# Patient Record
Sex: Male | Born: 2000 | Race: White | Hispanic: No | Marital: Single | Smoking: Never smoker
Health system: Southern US, Community
[De-identification: ages and names within clinical notes are randomized; demographics above are authoritative.]

## PROBLEM LIST (undated history)

## (undated) DIAGNOSIS — F840 Autistic disorder: Secondary | ICD-10-CM

---
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
## 2020-08-17 ENCOUNTER — Telehealth: Payer: Self-pay

## 2020-08-23 NOTE — Telephone Encounter (Signed)
Error

## 2022-08-24 ENCOUNTER — Emergency Department
Admission: EM | Admit: 2022-08-24 | Discharge: 2022-08-24 | Disposition: A | Discharge status: Home / Self Care | Payer: Medicare Other | Attending: Emergency Medicine | Admitting: Emergency Medicine

## 2022-08-24 ENCOUNTER — Other Ambulatory Visit: Payer: Self-pay

## 2022-08-24 DIAGNOSIS — M62838 Other muscle spasm: Secondary | ICD-10-CM | POA: Insufficient documentation

## 2022-08-24 DIAGNOSIS — M542 Cervicalgia: Secondary | ICD-10-CM | POA: Insufficient documentation

## 2022-08-24 DIAGNOSIS — F840 Autistic disorder: Secondary | ICD-10-CM | POA: Insufficient documentation

## 2022-08-24 MED ORDER — KETOROLAC TROMETHAMINE 15 MG/ML IJ SOLN
15.0000 mg | Freq: Once | INTRAMUSCULAR | Status: AC
  Administered 2022-08-24: 15 mg via INTRAMUSCULAR
  Filled 2022-08-24: qty 1

## 2022-08-24 MED ORDER — NAPROXEN 500 MG PO TABS: 500.0000 mg | ORAL_TABLET | Freq: Two times a day (BID) | ORAL | 0 refills | Status: AC

## 2022-08-24 MED ORDER — LIDOCAINE 5 % EX PTCH: 1.0000 | MEDICATED_PATCH | Freq: Two times a day (BID) | CUTANEOUS | 0 refills | Status: AC

## 2022-08-24 MED ORDER — LIDOCAINE 5 % EX PTCH
1.0000 | MEDICATED_PATCH | CUTANEOUS | Status: DC
  Administered 2022-08-24: 1 via TRANSDERMAL
  Filled 2022-08-24: qty 1

## 2022-08-24 NOTE — Discharge Instructions (Signed)
Continue to use warm compresses and gentle stretching and massage to the area.  You may also take the medications as prescribed to help with your symptoms.  Your symptoms may be due to lifting heavy objects, be cognizant of this and be sure to use good form, and try to reduce the amount of weight that you lift at a time.  Please return for any new, worsening, or change in symptoms or other concerns including headaches, visual changes, numbness, tingling, weakness, shooting pains down your arms, fevers, chills, or any other concerns.  It was a pleasure caring for you today.

## 2022-08-24 NOTE — ED Provider Notes (Signed)
[facility] Provider Note    Event Date/Time   First MD Initiated Contact with Patient 08/24/22 [phone]     (approximate)   History   Neck Pain   HPI  Jerome Mendoza is a 21 y.o. male with a past medical history of autism who presents today for evaluation of neck pain.  Patient reports that this has been ongoing for 1 week.  He reports that he works at [employer] and constantly is lifting heavy boxes.  He reports that his pain is between his lateral neck and shoulders.  He reports that it feels really tight.  His pain is worst with moving his head left and right, though he is able to move his head fully.  He denies headache, visual changes, numbness, tingling, weakness in his arms or legs.  No urinary or fecal incontinence or retention.  No sore throat or trouble swallowing.  No drooling.  No fevers or chills.  He denies any specific injury.     Physical Exam   Triage Vital Signs: ED Triage Vitals  Enc Vitals Group     BP 08/24/22 0904 115/74     Pulse Rate 08/24/22 0904 82     Resp 08/24/22 0904 18     Temp 08/24/22 0904 97.7 F (36.5 C)     Temp Source 08/24/22 0904 Oral     SpO2 08/24/22 0904 96 %     Weight --      Height --      Head Circumference --      Peak Flow --      Pain Score 08/24/22 0900 10     Pain Loc --      Pain Edu? --      Excl. in GC? --     Most recent vital signs: Vitals:   08/24/22 0904  BP: 115/74  Pulse: 82  Resp: 18  Temp: 97.7 F (36.5 C)  SpO2: 96%    Physical Exam Vitals and nursing note reviewed.  Constitutional:      General: Awake and alert. No acute distress.    Appearance: Normal appearance. The patient is normal weight.  HENT:     Head: Normocephalic and atraumatic.     Mouth: Mucous membranes are moist.  Eyes:     General: PERRL. Normal EOMs        Right eye: No discharge.        Left eye: No discharge.     Conjunctiva/sclera: Conjunctivae normal.  Cardiovascular:     Rate and Rhythm: Normal rate  and regular rhythm.     Pulses: Normal pulses.     Heart sounds: Normal heart sounds Pulmonary:     Effort: Pulmonary effort is normal. No respiratory distress.     Breath sounds: Normal breath sounds.  Abdominal:     Abdomen is soft. There is no abdominal tenderness. No rebound or guarding. No distention. Musculoskeletal:        General: No swelling. Normal range of motion.     Cervical back: Normal range of motion and neck supple.  No midline cervical spine tenderness. Normal and full rotation of neck, normal flexion and extension of neck. Tenderness to palpation with palpable muscle spasm to bilateral trapezius muscles.  Full range of motion of neck.  Negative Spurling test.  Negative Lhermitte sign.  Normal strength and sensation in bilateral upper extremities. Normal grip strength bilaterally.  Normal intrinsic muscle function of the hand bilaterally.  Normal radial  pulses bilaterally. Skin:    General: Skin is warm and dry.     Capillary Refill: Capillary refill takes less than 2 seconds.     Findings: No rash.  Neurological:     Mental Status: The patient is awake and alert.      ED Results / Procedures / Treatments   Labs (all labs ordered are listed, but only abnormal results are displayed) Labs Reviewed - No data to display   EKG     RADIOLOGY     PROCEDURES:  Critical Care performed:   Procedures   MEDICATIONS ORDERED IN ED: Medications  lidocaine (LIDODERM) 5 % 1 patch (1 patch Transdermal Patch Applied 08/24/22 0924)  ketorolac (TORADOL) 15 MG/ML injection 15 mg (15 mg Intramuscular Given 08/24/22 0922)     IMPRESSION / MDM / ASSESSMENT AND PLAN / ED COURSE  I reviewed the triage vital signs and the nursing notes.   Differential diagnosis includes, but is not limited to, muscle spasm, muscle strain, radiculopathy.  There is no trauma or midline cervical spine tenderness to suggest cervical spine fracture.  He has full normal range of motion of his  neck without rigidity.  No fever, headache, altered mental status or stiffness to suggest meningitis.  No constitutional symptoms or fever to suggest epidural abscess.  Furthermore, patient is not immunocompromise, no history of IV drug use.  Negative Spurling and Lhermitte sign, no radicular symptoms, not consistent with cervical radiculopathy.  Normal strength and sensation of bilateral upper extremities, normal grip strength, no urinary/fecal incontinence or retention or evidence of cord compression. No headache or visual changes or cranial nerve deficits or syncope or unilateral pain to suggest vascular etiology.  Patient does admit to lifting many heavy things at his job at [employer], and has reproducible muscle spasm with tenderness, symptoms and physical exam findings most consistent with muscle spasm versus strain.  He was given a Lidoderm patch and Toradol with significant improvement of his symptoms.  He was started on these medications at home.  We discussed the importance of warm compresses and gentle stretching.  We also discussed return precautions.  Patient and mom understand and agree with plan.  Patient was discharged in stable condition.   Patient's presentation is most consistent with acute illness / injury with system symptoms.     FINAL CLINICAL IMPRESSION(S) / ED DIAGNOSES   Final diagnoses:  Neck pain  Trapezius muscle spasm     Rx / DC Orders   ED Discharge Orders          Ordered    lidocaine (LIDODERM) 5 %  Every 12 hours        08/24/22 0929    naproxen (NAPROSYN) 500 MG tablet  2 times daily with meals        08/24/22 2876             Note:  This document was prepared using Dragon voice recognition software and may include unintentional dictation errors.   Keturah Shavers 08/24/22 1123    Concha Se, MD 08/24/22 1257

## 2022-08-24 NOTE — ED Triage Notes (Signed)
Pt to ED via POV with mother for neck pain. Pt mother states that it has been going on for about 1 week and that pt cried himself to sleep last night due to the pain. Pt has taken OTC medication without relief.
